# Patient Record
Sex: Male | Born: 1985 | Hispanic: No | Marital: Single
Health system: Midwestern US, Community
[De-identification: ages and names within clinical notes are randomized; demographics above are authoritative.]

## PROBLEM LIST (undated history)

## (undated) DIAGNOSIS — T7840XA Allergy, unspecified, initial encounter: Secondary | ICD-10-CM

## (undated) DIAGNOSIS — M1A00X0 Idiopathic chronic gout, unspecified site, without tophus (tophi): Secondary | ICD-10-CM

## (undated) HISTORY — DX: Allergy, unspecified, initial encounter: T78.40XA

---
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
## 2006-05-16 ENCOUNTER — Encounter: Admission: RE | Admit: 2006-05-16 | Discharge: 2006-05-16 | Payer: Self-pay | Admitting: Occupational Medicine

## 2006-06-04 ENCOUNTER — Inpatient Hospital Stay (HOSPITAL_COMMUNITY): Admission: EM | Admit: 2006-06-04 | Discharge: 2006-06-05 | Payer: Self-pay | Admitting: Emergency Medicine

## 2006-09-17 ENCOUNTER — Emergency Department (HOSPITAL_COMMUNITY): Admission: EM | Admit: 2006-09-17 | Discharge: 2006-09-17 | Payer: Self-pay | Admitting: Emergency Medicine

## 2010-12-31 ENCOUNTER — Emergency Department (HOSPITAL_COMMUNITY)
Admission: EM | Admit: 2010-12-31 | Discharge: 2011-01-01 | Disposition: A | Discharge status: Home / Self Care | Payer: BC Managed Care – PPO | Attending: Emergency Medicine | Admitting: Emergency Medicine

## 2010-12-31 DIAGNOSIS — Z0389 Encounter for observation for other suspected diseases and conditions ruled out: Secondary | ICD-10-CM | POA: Insufficient documentation

## 2011-01-01 ENCOUNTER — Emergency Department (HOSPITAL_COMMUNITY)
Admission: EM | Admit: 2011-01-01 | Discharge: 2011-01-01 | Disposition: A | Discharge status: Home / Self Care | Payer: BC Managed Care – PPO | Source: Home / Self Care | Attending: Emergency Medicine | Admitting: Emergency Medicine

## 2011-01-01 ENCOUNTER — Ambulatory Visit (INDEPENDENT_AMBULATORY_CARE_PROVIDER_SITE_OTHER): Payer: BC Managed Care – PPO

## 2011-01-01 ENCOUNTER — Inpatient Hospital Stay (INDEPENDENT_AMBULATORY_CARE_PROVIDER_SITE_OTHER)
Admission: RE | Admit: 2011-01-01 | Discharge: 2011-01-01 | Disposition: A | Discharge status: Home / Self Care | Payer: BC Managed Care – PPO | Source: Ambulatory Visit | Attending: Family Medicine | Admitting: Family Medicine

## 2011-01-01 DIAGNOSIS — F17200 Nicotine dependence, unspecified, uncomplicated: Secondary | ICD-10-CM | POA: Insufficient documentation

## 2011-01-01 DIAGNOSIS — D72819 Decreased white blood cell count, unspecified: Secondary | ICD-10-CM | POA: Insufficient documentation

## 2011-01-01 DIAGNOSIS — R509 Fever, unspecified: Secondary | ICD-10-CM

## 2011-01-01 DIAGNOSIS — T370X5A Adverse effect of sulfonamides, initial encounter: Secondary | ICD-10-CM | POA: Insufficient documentation

## 2011-01-01 DIAGNOSIS — D696 Thrombocytopenia, unspecified: Secondary | ICD-10-CM | POA: Insufficient documentation

## 2011-01-01 DIAGNOSIS — L02419 Cutaneous abscess of limb, unspecified: Secondary | ICD-10-CM | POA: Insufficient documentation

## 2011-01-01 LAB — CBC
HCT: 41.3 % (ref 39.0–52.0)
Hemoglobin: 15 g/dL (ref 13.0–17.0)
MCH: 30.5 pg (ref 26.0–34.0)
MCHC: 36.3 g/dL — ABNORMAL HIGH (ref 30.0–36.0)
MCV: 84.1 fL (ref 78.0–100.0)
Platelets: 82 10*3/uL — ABNORMAL LOW (ref 150–400)
RBC: 4.91 MIL/uL (ref 4.22–5.81)
RDW: 12.2 % (ref 11.5–15.5)
WBC: 2.3 10*3/uL — ABNORMAL LOW (ref 4.0–10.5)

## 2011-01-01 LAB — DIFFERENTIAL
Basophils Absolute: 0 10*3/uL (ref 0.0–0.1)
Basophils Relative: 1 % (ref 0–1)
Eosinophils Absolute: 0 10*3/uL (ref 0.0–0.7)
Eosinophils Relative: 0 % (ref 0–5)
Lymphocytes Relative: 21 % (ref 12–46)
Lymphs Abs: 0.5 10*3/uL — ABNORMAL LOW (ref 0.7–4.0)
Monocytes Absolute: 0.2 10*3/uL (ref 0.1–1.0)
Monocytes Relative: 10 % (ref 3–12)
Neutro Abs: 1.6 10*3/uL — ABNORMAL LOW (ref 1.7–7.7)
Neutrophils Relative %: 68 % (ref 43–77)

## 2011-01-01 LAB — COMPREHENSIVE METABOLIC PANEL
Albumin: 4.2 g/dL (ref 3.5–5.2)
BUN: 12 mg/dL (ref 6–23)
Calcium: 9 mg/dL (ref 8.4–10.5)
Creatinine, Ser: 1.03 mg/dL (ref 0.50–1.35)
Total Protein: 7 g/dL (ref 6.0–8.3)

## 2011-01-01 LAB — CK: Total CK: 128 U/L (ref 7–232)

## 2011-01-02 ENCOUNTER — Inpatient Hospital Stay (HOSPITAL_COMMUNITY)
Admission: EM | Admit: 2011-01-02 | Discharge: 2011-01-05 | DRG: 397 | Disposition: A | Discharge status: Home / Self Care | Payer: BC Managed Care – PPO | Attending: Internal Medicine | Admitting: Internal Medicine

## 2011-01-02 ENCOUNTER — Emergency Department (HOSPITAL_COMMUNITY): Payer: BC Managed Care – PPO

## 2011-01-02 DIAGNOSIS — D6959 Other secondary thrombocytopenia: Principal | ICD-10-CM | POA: Diagnosis present

## 2011-01-02 DIAGNOSIS — T370X5A Adverse effect of sulfonamides, initial encounter: Secondary | ICD-10-CM | POA: Diagnosis present

## 2011-01-02 DIAGNOSIS — R7402 Elevation of levels of lactic acid dehydrogenase (LDH): Secondary | ICD-10-CM | POA: Diagnosis present

## 2011-01-02 DIAGNOSIS — R5081 Fever presenting with conditions classified elsewhere: Secondary | ICD-10-CM | POA: Diagnosis present

## 2011-01-02 DIAGNOSIS — F17200 Nicotine dependence, unspecified, uncomplicated: Secondary | ICD-10-CM | POA: Diagnosis present

## 2011-01-02 DIAGNOSIS — E876 Hypokalemia: Secondary | ICD-10-CM | POA: Diagnosis present

## 2011-01-02 DIAGNOSIS — L02419 Cutaneous abscess of limb, unspecified: Secondary | ICD-10-CM | POA: Diagnosis present

## 2011-01-02 DIAGNOSIS — R7309 Other abnormal glucose: Secondary | ICD-10-CM | POA: Diagnosis present

## 2011-01-02 DIAGNOSIS — R109 Unspecified abdominal pain: Secondary | ICD-10-CM | POA: Diagnosis present

## 2011-01-02 DIAGNOSIS — D72819 Decreased white blood cell count, unspecified: Secondary | ICD-10-CM | POA: Diagnosis present

## 2011-01-02 DIAGNOSIS — R7401 Elevation of levels of liver transaminase levels: Secondary | ICD-10-CM | POA: Diagnosis present

## 2011-01-02 DIAGNOSIS — R82998 Other abnormal findings in urine: Secondary | ICD-10-CM | POA: Diagnosis not present

## 2011-01-02 LAB — COMPREHENSIVE METABOLIC PANEL
ALT: 75 U/L — ABNORMAL HIGH (ref 0–53)
AST: 80 U/L — ABNORMAL HIGH (ref 0–37)
Albumin: 3.4 g/dL — ABNORMAL LOW (ref 3.5–5.2)
Alkaline Phosphatase: 89 U/L (ref 39–117)
CO2: 23 mEq/L (ref 19–32)
Chloride: 100 mEq/L (ref 96–112)
Creatinine, Ser: 0.93 mg/dL (ref 0.50–1.35)
GFR calc non Af Amer: >60 mL/min (ref >60)
Potassium: 3.4 mEq/L — ABNORMAL LOW (ref 3.5–5.1)
Total Bilirubin: 1.5 mg/dL — ABNORMAL HIGH (ref 0.3–1.2)

## 2011-01-02 LAB — URINALYSIS, ROUTINE W REFLEX MICROSCOPIC
Glucose, UA: NEGATIVE mg/dL
Hgb urine dipstick: NEGATIVE
Specific Gravity, Urine: 1.028 (ref 1.005–1.030)
pH: 5.5 (ref 5.0–8.0)

## 2011-01-02 LAB — DIFFERENTIAL
Basophils Relative: 3 % — ABNORMAL HIGH (ref 0–1)
Eosinophils Absolute: 0 10*3/uL (ref 0.0–0.7)
Eosinophils Relative: 0 % (ref 0–5)
Lymphocytes Relative: 24 % (ref 12–46)
Neutrophils Relative %: 59 % (ref 43–77)

## 2011-01-02 LAB — CBC
HCT: 38.7 % — ABNORMAL LOW (ref 39.0–52.0)
MCV: 83.9 fL (ref 78.0–100.0)
Platelets: 41 10*3/uL — ABNORMAL LOW (ref 150–400)
RBC: 4.61 MIL/uL (ref 4.22–5.81)
WBC: 1.6 10*3/uL — ABNORMAL LOW (ref 4.0–10.5)

## 2011-01-02 LAB — LACTIC ACID, PLASMA: Lactic Acid, Venous: 0.7 mmol/L (ref 0.5–2.2)

## 2011-01-02 LAB — PROCALCITONIN: Procalcitonin: 0.43 ng/mL

## 2011-01-02 LAB — URINE MICROSCOPIC-ADD ON

## 2011-01-02 LAB — PROTIME-INR: INR: 1.21 (ref 0.00–1.49)

## 2011-01-03 DIAGNOSIS — D709 Neutropenia, unspecified: Secondary | ICD-10-CM

## 2011-01-03 LAB — BASIC METABOLIC PANEL WITH GFR
BUN: 11 mg/dL (ref 6–23)
CO2: 25 meq/L (ref 19–32)
Calcium: 8.3 mg/dL — ABNORMAL LOW (ref 8.4–10.5)
Chloride: 104 meq/L (ref 96–112)
Creatinine, Ser: 0.75 mg/dL (ref 0.50–1.35)
GFR calc Af Amer: >60 mL/min
GFR calc non Af Amer: >60 mL/min
Glucose, Bld: 104 mg/dL — ABNORMAL HIGH (ref 70–99)
Potassium: 3.3 meq/L — ABNORMAL LOW (ref 3.5–5.1)
Sodium: 135 meq/L (ref 135–145)

## 2011-01-03 LAB — CBC
HCT: 36.1 % — ABNORMAL LOW (ref 39.0–52.0)
Hemoglobin: 12.6 g/dL — ABNORMAL LOW (ref 13.0–17.0)
MCH: 29.6 pg (ref 26.0–34.0)
MCHC: 34.9 g/dL (ref 30.0–36.0)
RDW: 12.4 % (ref 11.5–15.5)

## 2011-01-03 LAB — TSH: TSH: 3.069 u[IU]/mL (ref 0.350–4.500)

## 2011-01-03 LAB — DIFFERENTIAL
Band Neutrophils: 6 % (ref 0–10)
Basophils Absolute: 0 K/uL (ref 0.0–0.1)
Basophils Relative: 0 % (ref 0–1)
Blasts: 0 %
Eosinophils Absolute: 0 K/uL (ref 0.0–0.7)
Eosinophils Relative: 0 % (ref 0–5)
Lymphocytes Relative: 26 % (ref 12–46)
Lymphs Abs: 0.4 K/uL — ABNORMAL LOW (ref 0.7–4.0)
Metamyelocytes Relative: 0 %
Monocytes Absolute: 0.1 K/uL (ref 0.1–1.0)
Monocytes Relative: 5 % (ref 3–12)
Myelocytes: 0 %
Neutro Abs: 1 K/uL — ABNORMAL LOW (ref 1.7–7.7)
Neutrophils Relative %: 63 % (ref 43–77)
Promyelocytes Absolute: 0 %
nRBC: 0 /100{WBCs}

## 2011-01-03 LAB — BASIC METABOLIC PANEL
BUN: 12 mg/dL (ref 6–23)
Calcium: 8.3 mg/dL — ABNORMAL LOW (ref 8.4–10.5)
GFR calc Af Amer: >60 mL/min (ref >60)
GFR calc non Af Amer: >60 mL/min (ref >60)
Glucose, Bld: 109 mg/dL — ABNORMAL HIGH (ref 70–99)
Potassium: 3.5 mEq/L (ref 3.5–5.1)
Sodium: 134 mEq/L — ABNORMAL LOW (ref 135–145)

## 2011-01-03 LAB — HEPATITIS PANEL, ACUTE
HCV Ab: NEGATIVE
Hep A IgM: NEGATIVE
Hep B C IgM: NEGATIVE
Hepatitis B Surface Ag: NEGATIVE

## 2011-01-03 LAB — HEMOGLOBIN A1C
Hgb A1c MFr Bld: 6.1 % — ABNORMAL HIGH
Mean Plasma Glucose: 128 mg/dL — ABNORMAL HIGH

## 2011-01-03 LAB — HIV ANTIBODY (ROUTINE TESTING W REFLEX): HIV: NONREACTIVE

## 2011-01-04 ENCOUNTER — Inpatient Hospital Stay (HOSPITAL_COMMUNITY): Payer: BC Managed Care – PPO

## 2011-01-04 DIAGNOSIS — D61818 Other pancytopenia: Secondary | ICD-10-CM

## 2011-01-04 LAB — CBC
HCT: 37 % — ABNORMAL LOW (ref 39.0–52.0)
Hemoglobin: 13.3 g/dL (ref 13.0–17.0)
MCH: 30.3 pg (ref 26.0–34.0)
MCHC: 35.9 g/dL (ref 30.0–36.0)
MCV: 84.3 fL (ref 78.0–100.0)
Platelets: 35 10*3/uL — ABNORMAL LOW (ref 150–400)
RBC: 4.39 MIL/uL (ref 4.22–5.81)
RDW: 12.8 % (ref 11.5–15.5)
WBC: 6 10*3/uL (ref 4.0–10.5)

## 2011-01-04 LAB — DIFFERENTIAL
Basophils Relative: 3 % — ABNORMAL HIGH (ref 0–1)
Eosinophils Absolute: 0.1 10*3/uL (ref 0.0–0.7)
Eosinophils Relative: 1 % (ref 0–5)
Lymphs Abs: 1.2 10*3/uL (ref 0.7–4.0)
Monocytes Absolute: 0.6 10*3/uL (ref 0.1–1.0)
Neutro Abs: 3.9 10*3/uL (ref 1.7–7.7)

## 2011-01-04 LAB — URINE CULTURE
Colony Count: NO GROWTH
Culture  Setup Time: 201207220221

## 2011-01-04 LAB — BASIC METABOLIC PANEL
CO2: 27 mEq/L (ref 19–32)
Calcium: 8.5 mg/dL (ref 8.4–10.5)
Chloride: 105 mEq/L (ref 96–112)
Sodium: 138 mEq/L (ref 135–145)

## 2011-01-04 LAB — PATHOLOGIST SMEAR REVIEW

## 2011-01-04 LAB — ROCKY MTN SPOTTED FVR AB, IGG-BLOOD: RMSF IgG: 0.08 IV

## 2011-01-04 NOTE — H&P (Signed)
[name], [name] NO.:  [id number]  MEDICAL RECORD NO.:  [id number]  LOCATION:  WLED                         FACILITY:  [facility]  PHYSICIAN:  Della Goo, M.D. DATE OF BIRTH:  August 12, 1985  DATE OF ADMISSION:  01/02/2011 DATE OF DISCHARGE:                             HISTORY & PHYSICAL   PRIMARY CARE PHYSICIAN:  Unassigned.  CHIEF COMPLAINT:  Fevers.  HISTORY OF PRESENT ILLNESS:  This is a 25 year old male, who presents to the [facility] Emergency Department secondary to complaints of worsening fevers and chills along with headaches and myalgias for the past 5 days.  The patient had been seen 1 day ago in the emergency department and in the [facility] as well secondary to similar complaints.  Blood cultures were done x2 and at 24 hours, there is no growth.  The patient had been found to have thrombocytopenia at that time with a platelet level of 82.  He returned and has been seen in the emergency department today.  His laboratory studies have worsened.  His white blood cell count is 1.6.  His platelets have dropped to 41.  His hemoglobin, however, is 13.6.  More history in regard to the patient's illness, the patient was diagnosed with an abscess, possible community MRSA on the right thigh and underwent an incision and drainage at the [facility] on July 9th.  He was started on Bactrim therapy at that time.  The patient continued on the Bactrim therapy until 5 days ago when he began to have fevers and chills and feeling worse.  He says he stopped the Bactrim therapy at that time.  The patient states at home, he had been having temperatures to 103 and 104,  however the abscess of the right thigh did get better.  In the emergency department today, the emergency department physician, Dr. Rosalia Hammers, evaluated this patient and had concerns that [facility] Spotted Fever was in the differential.  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  He has no known  history of seeing a tick or having a tick bite.  [place] Spotted Fever titers were performed along with blood cultures, chest x- ray, and urinalysis and the patient was placed on Rocephin therapy along with IV doxycycline.  He was referred for medical admission, but also IV fluids have been given for fluid resuscitation and antipyretic therapy has also been given.  Of note, the patient had been taking aspirin daily and this will be discontinued secondary to his low platelets.  PAST MEDICAL HISTORY:  Significant for, 1. Peritonsillar abscess incision and drainage in the past. 2. Also, the patient has had a history of a cholesteatoma of the left     ear, status post surgical resection.  MEDICATIONS:  Bactrim therapy double-strength 1 tablet p.o. b.i.d., which he took 8 days of therapy.  Also, the patient had been taking aspirin daily.  ALLERGIES:  PENICILLIN.  SOCIAL HISTORY:  The patient is married.  He works as a Games developer. He is a smoker, smokes a pack of cigarettes daily.  He reports not  smoking at all since he has been ill.  He denies any alcohol usage, denies any illicit drug usage.  FAMILY HISTORY:  Noncontributory.  REVIEW OF SYSTEMS:  Pertinents mentioned above.  Also, the patient has had nausea, but no vomiting and he has had lower abdominal pain.  He denies having any dysuria or diarrhea.  He also denies having any syncope or seizures.  PHYSICAL EXAMINATION FINDINGS:  GENERAL:  The 25 year old thin, well- nourished, well-developed appearing Caucasian male, who is in no acute distress currently. VITAL SIGNS:  Temperature 102.9; blood pressure initially 129/100, now 100/41; heart rate 92; respirations 20; O2 sats 94% to 96%. HEENT:  Normocephalic, atraumatic.  Pupils equally round, reactive to light.  Extraocular movements are intact.  Funduscopic benign.  There is no scleral  icterus.  Nares are patent bilaterally.  Oropharynx is clear. NECK:  Supple, full range of motion.  No thyromegaly, adenopathy, or jugular venous distention. CARDIOVASCULAR:  Regular rate and rhythm.  No murmurs, gallops, or rubs appreciated.  Normal S1, S2. RESPIRATORY:  Chest wall normal excursion and breathing is unlabored. Chest wall excursion is symmetric.  Lungs are clear to auscultation bilaterally.  No rales, rhonchi or wheezes. ABDOMEN:  Positive bowel sounds, soft, nontender, nondistended.  There is no hepatosplenomegaly.  There is no rebound, no guarding, and deep palpation is able to be performed of the abdomen.  There is no suprapubic tenderness. BACK:  No spinous process tenderness.  No costovertebral angle tenderness.  No palpable spasms. EXTREMITIES:  Without cyanosis, clubbing, or edema. SKIN:  The patient has numerous tattoos, but there is no rash that is seen.  No rash or exanthem. NEUROLOGIC:  The patient is alert and oriented x3.  Cranial nerves are intact.  He is able to move all 4 of his extremities.  There are no motor or sensory deficits.  LABORATORY STUDIES:  White blood cell count 1.6, hemoglobin 13.6, hematocrit 38.7, platelets 41.  Sodium 132, potassium 3.4, chloride 100, CO2 23, BUN 12, creatinine 0.93, and glucose 118.  Prothrombin time 15.6, INR 1.21, PTT 43.  Chest x-ray is negative revealing no acute cardiopulmonary disease findings.  Urinalysis reveals small leukocyte esterase, moderate bilirubin, and protein of 30.  Albumin 3.4, total protein 6.3, AST 80, ALT 75, alkaline phosphatase 89, total bilirubin 1.5.  ASSESSMENT:  This is a 25 year old male being admitted with, 1. Febrile illness, sepsis versus sepsis syndrome. 2. Neutropenia. 3. Thrombocytopenia. 4. Elevated transaminases. 5. Lower abdominal pain. 6. Nausea.  PLAN:  The patient will be admitted, blood cultures x2 have been ordered.  The [facility] spotted fever titers have also  been sent. A lactic acid level and procalcitonin level will also be ordered and the patient has been placed on empiric antibiotic therapy at this time of Rocephin and doxycycline.  A hepatitis panel will also be started along with an HIV test will also be sent and the aspirin therapy has been discontinued as far as the low platelets go.  The white blood cell count and platelets will be monitored and a hematology consultation has been requested.  Dr. Myna Hidalgo has been contacted who is on-call and he will see this patient in the a.m.  The etiology of the patient's neutropenia and thrombocytopenia may be secondary to sepsis, may also be a consequence of the Bactrim therapy.  However, other infectious etiologies will be evaluated such as hepatitis and possible HIV.  Other considerations, which were less likely, are possible parvovirus, however, the patient's red blood cell  lines are within normal indices at this time and the patient also does not have a viral erythematous exanthem.  IV fluids have been ordered too for fluid resuscitation  nd SCDs have been ordered for DVT prophylaxis.  Antiemetic therapy has also been ordered as well and the patient will be further monitored for further clinical changes and his antibiotic therapy will be further adjusted pending results of his studies.     Della Goo, M.D.     HJ/MEDQ  D:  01/02/2011  T:  01/02/2011  Job:  [id number]  Electronically Signed by Della Goo M.D. on 01/04/2011 12:59:07 PM

## 2011-01-05 LAB — DIFFERENTIAL
Eosinophils Relative: 2 % (ref 0–5)
Lymphocytes Relative: 37 % (ref 12–46)
Lymphs Abs: 2.6 10*3/uL (ref 0.7–4.0)
Monocytes Relative: 15 % — ABNORMAL HIGH (ref 3–12)
Neutrophils Relative %: 46 % (ref 43–77)

## 2011-01-05 LAB — LEAD, BLOOD: Lead-Whole Blood: 0.1 ug/dL (ref <10.0)

## 2011-01-05 LAB — BASIC METABOLIC PANEL
CO2: 25 mEq/L (ref 19–32)
Chloride: 105 mEq/L (ref 96–112)
Glucose, Bld: 93 mg/dL (ref 70–99)
Potassium: 3.3 mEq/L — ABNORMAL LOW (ref 3.5–5.1)
Sodium: 136 mEq/L (ref 135–145)

## 2011-01-05 LAB — PATHOLOGIST SMEAR REVIEW

## 2011-01-05 LAB — CBC
Hemoglobin: 11.9 g/dL — ABNORMAL LOW (ref 13.0–17.0)
RBC: 4.05 MIL/uL — ABNORMAL LOW (ref 4.22–5.81)
WBC: 7.1 10*3/uL (ref 4.0–10.5)

## 2011-01-07 LAB — CULTURE, BLOOD (ROUTINE X 2)
Culture  Setup Time: 201207202140
Culture: NO GROWTH

## 2011-01-09 LAB — CULTURE, BLOOD (ROUTINE X 2): Culture: NO GROWTH

## 2011-01-29 NOTE — Discharge Summary (Signed)
[name], [name] NO.:  [id number]  MEDICAL RECORD NO.:  [id number]  LOCATION:  1313                         FACILITY:  [facility]  PHYSICIAN:  Rosanna Randy, MDDATE OF BIRTH:  1986-05-11  DATE OF ADMISSION:  01/02/2011 DATE OF DISCHARGE:  01/05/2011                              DISCHARGE SUMMARY   Dr. Windle Guard who is going to become the patient primary care physician and will follow with him in the next 7-10 days.  DISCHARGE DIAGNOSES: 1. Thrombocytopenia/leukopenia secondary to the use of Bactrim. 2. Transaminitis, most likely secondary to Bactrim as well drug-     related fever. 3. Hyperglycemia. 4. Hypokalemia. 5. Abdominal pain with associated nausea and vomiting, most likely     secondary to viral gastroenteritis. 6. Pyuria with a negative urine culture. 7. 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  DISCHARGE MEDICATIONS: 1. Tylenol 650 mg every 6 hours as needed for fever and pain. 2. Multivitamins 1 tablet by mouth daily.  DISPOSITION AND FOLLOWUP:  The patient had been discharged in stable and improved condition.  Currently, not complaining of any abdominal pain, nausea, vomiting, shortness of breath, or any other acute discomfort. The patient is going to arrange hospital followup appointment with Dr. Windle Guard over the next 7-10 days in order to establish relationship as a primary care physician and also at that moment to follow on his white blood cells and platelets level.  It will be also important to repeat a fasting blood sugar over the next 3 months and a hemoglobin A1c since the patient had elevated blood sugar found during this admission with a hemoglobin A1c of 6.1.  In case that the patient's platelets or white blood cells continued to be low, he is going to require and arrange followup with Dr. Arlan Organ  who is a hematology oncologist following the case along.  The patient was  instructed to follow a low- carbohydrate diet and more than anything to stop the over use of power energy drinks he was initially doing prior to admission.  PROCEDURES PERFORMED:  During this hospitalization, the patient had a chest x-ray 2 views on July 20th at 11:28 that demonstrated normal chest radiograph without acute pulmonary infiltrates.  He had a repeat 2 views x-ray on July 21st that demonstrated no active cardiopulmonary disease and also an ultrasound of his abdomen that demonstrated mild gallbladder thickening without gallstones and otherwise completely normal.  No other procedures were performed during this hospitalization.  Hematology/Oncology was consulted due to the patient's leukopenia and thrombocytopenia, specifically Dr. Arlan Organ.  HISTORY OF PRESENT ILLNESS:  For full details, please refer to dictation done on January 02, 2011, by Dr. Lovell Sheehan, but briefly this is a 25 year old male who presented to the [facility] Emergency Department secondary to complaints of worsening fever and chills along with headaches and myalgia for the past 5 days prior to admission.  In the ED, he was found to have platelets of 41 with white blood cells count of 1.6.  At that moment, [facility] hospitalist was called to admit the patient.  He also had a  temperature of 103.  PERTINENT LABORATORY DATA:  Throughout this admission includes a comprehensive metabolic panel that demonstrated a sodium of 132, potassium 3.4, chloride 100, bicarbonate 23, glucose 118, BUN 12, creatinine 0.93.  Alkaline phosphatase 89, AST 80, ALT 75, albumin 3.4, calcium 8.6. Blood culture negative x2 until the moment of discharge without any growth.  PT 15.6 with an INR of 1.21, PTT 43.  A CBC with white blood cells of 1.6, hemoglobin 13.6, platelets 41, monocytes count was 14, basophils 3.  Urinalysis demonstrated small amount of leukocytes, but negative nitrite, negative blood.  Microscopy 0-2 white blood  cells. Urine culture, no growth.  Procalcitonin level was 0.43, lactic acid 0.7.  HIV was nonreactive.  Hepatitis acute panel was negative.  TSH 3.069.  A subsequent BMET demonstrated a sodium of 134, potassium 3.5, chloride 103, bicarbonate 24, glucose of 110, BUN 12, creatinine 0.88, calcium 8.3, hemoglobin A1c was 6.1 with mean plasma glucose of 128. The patient had [facility] spotted fever, IgM and IgG within normal range 0.49 and 0.10 respectively.  At discharge, CBC demonstrated white blood cells of 7.1, hemoglobin 11.9, platelets 61, the patient's platelets dropped down up to 35 at one point throughout this hospitalization, late blood level was 0.1.  HOSPITAL COURSE BY PROBLEM: 1. Thrombocytopenia and leukopenia, most likely secondary to Bactrim     exposure.  The patient had about 10 days course of Bactrim prior to     coming in to the hospital with his new symptoms and findings.  As     per consultation with Hem/Onc and his recommendation, most likely     appears to be a myelosuppression process caused by the Bactrim.  He     received 1 dose of Neupogen that stimulate protrusion of his white     blood cells.  The patient remained afebrile over 36 hours and his     numbers continued to improve.  At that time, the patient had been     discharged with followup as an outpatient with his new to be     primary care physician, Dr. Windle Guard, in order to have his     white blood cells and platelets level checked again and if they are     still low, arrange followup with Dr. Myna Hidalgo as an outpatient. 2. Transaminitis that might be also secondary to Bactrim with a     negative workup as was done during this hospitalization.  This is     something that will be also followed as an outpatient.  The patient     had been instructed to avoid alcohol or any other substance that     may increase the risk of developing worsening liver function. 3. Drug-related fever.  Once the antibiotics  were completely taken     off, the patient has not spiked any further fever, there had not     been any source of infection.  At this point, the patient is going     to be discharged home with outpatient followup and he had agreed     that he spike any fever or feels bad in any way, he will return to     the hospital right away.  He had received instructions to use     Tylenol every 6 hours in order to help with these symptoms if     needed. 4. Hyperglycemia.  Not otherwise specified without history of     diabetes.  His hemoglobin  A1c is 6.1.  He had a blood sugar of 110     with a mean plasma by his A1c of 128.  At this moment, he qualifies     as a prediabetic stage.  Lifestyle modifications have been     discussed with him including a low-carbohydrate diet and     specifically to avoid power energy drinks which is something that     he used all the time.  He is going to follow with primary care     physician as an outpatient for further followup and treatment if     needed. 5. Hypokalemia which had been repleted throughout this     hospitalization.  At discharge, potassium was in the normal range. 6. Abdominal pain with associated nausea and vomiting, could be     associated with Bactrim versus just a viral gastroenteritis that     improved after fluid resuscitation and supportive care.  At     discharge, he is not having any nausea, any vomiting, and no     abdominal pain. 7. Pyuria.  On admission, urinalysis with a negative urine culture.     Initially, the patient received coverage with Rocephin for 2 days,     but after having negative culture, antibiotics have been completely     discontinued.  The patient remains to be afebrile without any     dysuria. 8. Recurrent boils skin infection, most likely due to his prediabetic     state.  He is going to follow with primary care physician and he     had been advised not to burst any boils to prevent further      infection.  PHYSICAL EXAMINATION:  VITAL SIGNS:  At discharge, temperature 97.7, heart rate 58, respiratory rate 16, blood pressure 124/58, oxygen saturation 98% on room air. GENERAL:  No acute distress. RESPIRATORY SYSTEM:  Clear to auscultation bilaterally. HEART:  Regular rate and rhythm.  No murmurs, gallops, or rubs. ABDOMEN:  Soft, nontender, nondistended with positive bowel sounds. EXTREMITIES:  Without edema, cyanosis, or clubbing. NEUROLOGIC:  Nonfocal.     Rosanna Randy, MD     CEM/MEDQ  D:  01/05/2011  T:  01/05/2011  Job:  [id number]  cc:   Windle Guard, M.D. Fax: [phone]  Electronically Signed by Vassie Loll MD on 01/29/2011 05:45:30 PM

## 2011-02-01 NOTE — Consult Note (Signed)
[name], BOLLS NO.:  [id number]  MEDICAL RECORD NO.:  [id number]  LOCATION:  1313                         FACILITY:  [facility]  PHYSICIAN:  Josph Clayton, M.D.  DATE OF BIRTH:  12-18-1985  DATE OF CONSULTATION:  01/03/2011 DATE OF DISCHARGE:                                CONSULTATION   REFERRING PHYSICIAN:  Della Goo, M.D.  REASON FOR CONSULTATION: 1. Leukopenia/thrombocytopenia. 2. Possible reaction to Bactrim. 3. History of Staph abscess on right thigh.  HISTORY OF PRESENT ILLNESS:  [name] is a 25 year old white gentleman. There is no past medical history.  He works as a Games developer.  He developed a Staph wound infection on his right thigh back a couple of weeks ago.  He was started on Bactrim for this.  He took Bactrim for about a week.  He then began to get some spiking temperatures.  He had little nausea, but no vomiting.  He had some chills, but no rigors.  He has a blood culture taken down 2 days ago.  These were negative.  On the January 01, 2011, his white cell count was 2.3, hemoglobin 15 and platelet count 82,000.  He subsequently was admitted on the January 02, 2011 with a high temperature of 102.9.  He did have a chest x-ray done on the January 02, 2011.  This was negative.  He does feel fatigued.  When he was admitted on the January 02, 2011, his white cell count was down to 1.6, hemoglobin 13.6, and hematocrit 38.7, platelet count was down to 41,000.  He had a lactic acid that was 0.7.  Procalcitonin was 0.43.  His HIV was nonreactive.  He had hepatitis panel secondary to some mildly elevated LFTs and these were all negative.  I was asked by Dr. Lovell Sheehan to see the patient.  PAST MEDICAL HISTORY:  His past medical history is relatively unremarkable.  ALLERGIES: 1. PENICILLIN. 2. I would also consider BACTRIM as a allergy.  MEDICATIONS:  His admission medications were none.  CURRENT MEDICATIONS:  He currently is  on: 1. Rocephin 1 gram IV daily. 2. Doxycycline 100 mg IV q.12h.  SOCIAL HISTORY:  Remarkable for some tobacco use.  There is some alcohol use.  There is no recreational drug use.  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  FAMILY HISTORY:  Noncontributory.  PHYSICAL EXAMINATION:  GENERAL:  This is a well-developed, well- nourished white gentleman, in no obvious distress. VITAL SIGNS:  Show a temperature of 100.6, pulse 77, respiratory rate 18, blood pressure 110/55. HEAD AND NECK EXAMINATION:  Shows normocephalic, atraumatic skull.  He is somewhat flushed in face.  There are no ocular or oral lesions. There is no scleral icterus.  There is no adenopathy in the neck. LUNGS:  Clear bilaterally.  There is no rales, wheezes or rhonchi. CARDIAC EXAMINATION:  Regular rate and rhythm with normal S1 and S2. There are no murmurs, rubs or bruits. ABDOMINAL EXAMINATION:  Soft.  Good bowel sounds.  There is no palpable abdominal mass.  There is no fluid wave.  There is no palpable hepatosplenomegaly. BACK EXAMINATION:  No tenderness of the spine, ribs  or hips. EXTREMITIES:  Shows the healing wound on the right lateral thigh.  There is no associated erythema with this wound.  He has good range of motion of his joints.  He has good pulses in his distal extremities. SKIN EXAMINATION:  Shows no unusual rashes, ecchymosis or petechiae. NEUROLOGICAL EXAMINATION:  Shows no focal neurological deficits.  LABORATORY DATA:  His laboratory studies show white cell count of 1.5, hemoglobin 12.6, hematocrit 36.1, platelet count 32,000.  MCV is 85. Sodium 134, potassium 3.5, BUN 12, creatinine 0.88.  Peripheral smear shows a normochromic normocytic palpation of red blood cells.  There is no nucleated red blood cells.  I see no schistocytes.  There is no polychromasia.  There is no rouleaux formation.  White cells appear decreased in number.  I do not see any immature myeloid cells.  He has no  hypersegmented polys.  No atypical lymphocytes.  He has no immature myeloid cells.  Platelets are decreased in number.  Most of the platelets are quite large and well granulated.  IMPRESSION:  [name] is a 25 year old gentleman with progressive leukopenia and thrombocytopenia.  This in the context of recently being on Bactrim.  I had to believe that this is a Bactrim reaction.  Everything certainly seems to fit with Bactrim being the culprit.  His blood smear looks unremarkable for any underlying hematologic malignancy.  Given the platelets are large, one has to wonder about an immune-mediated phenomenon, which Bactrim certainly could initiate.  It is also possible that this might be some lingering infection.  As such, I agree with the antibiotics that he is taking.  He does look somewhat sick.  As such, I think we will give him a dose of Neupogen.  I know this is somewhat controversial, but I do not see a real down sign to give no doses of Neupogen to try to get his white cell count back up a little bit.  It is possible that this might take several days to resolve.  If his platelet count continues to decline, one might consider intravenous immunoglobulin or some steroids to try to pick the platelet count back up.  [name] is although nice.  He is with his wife.  I talked to him at length.  He has good understanding of the situation.  We will follow along with the hospitalist team and help out as we can.     Josph Clayton, M.D.     PRE/MEDQ  D:  01/03/2011  T:  01/03/2011  Job:  [id number]  cc:   Della Goo, M.D. Fax: [phone]  Electronically Signed by Arlan Organ  on 02/01/2011 07:19:46 AM

## 2011-08-19 IMAGING — US US ABDOMEN COMPLETE
1 series · 14 of 25 positions shown · non-contrast
Comparison: None.

CLINICAL DATA: Abdominal pain with nausea and vomiting

COMPLETE ABDOMINAL ULTRASOUND

[Series 1: us abdomen complete · 0.32mm/px · 14 of 44 slices shown]
[im 1/44]
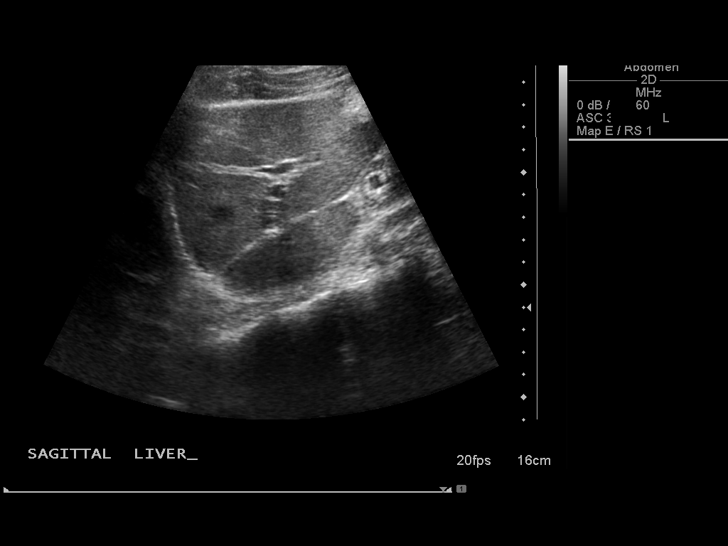
[im 4/44]
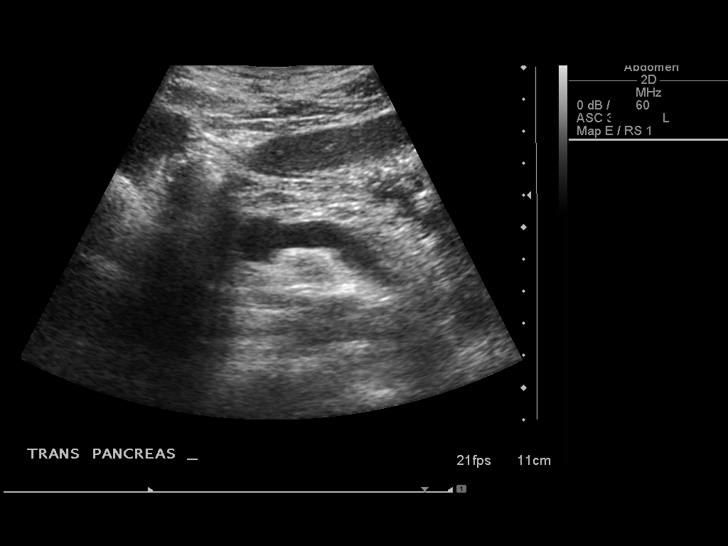
[im 8/44]
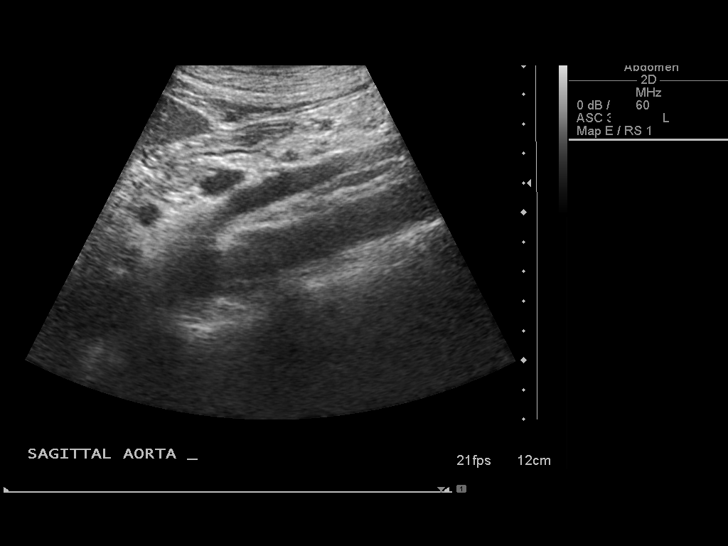
[im 11/44]
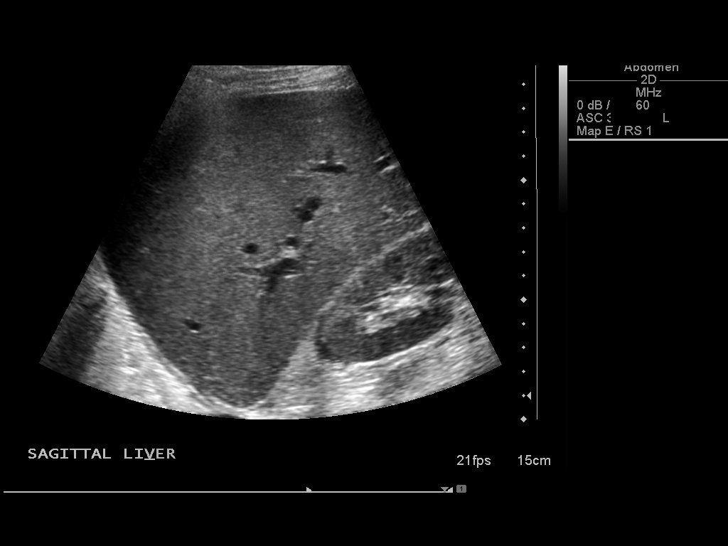
[im 15/44]
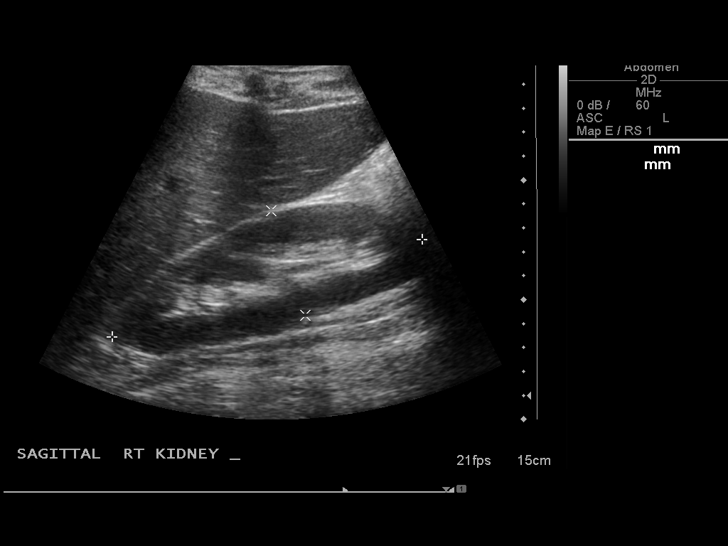
[im 17/44]
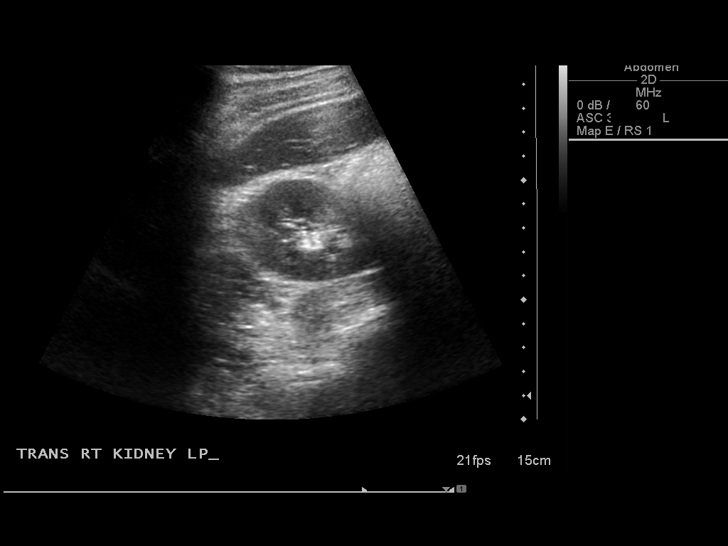
[im 20/44]
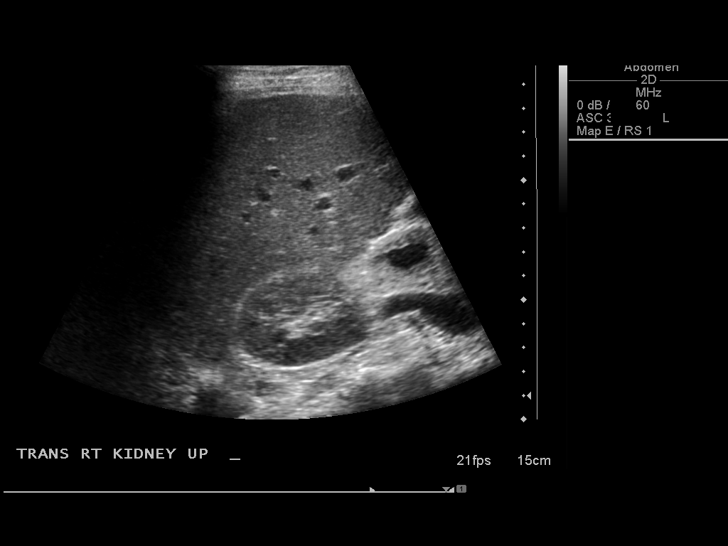
[im 24/44]
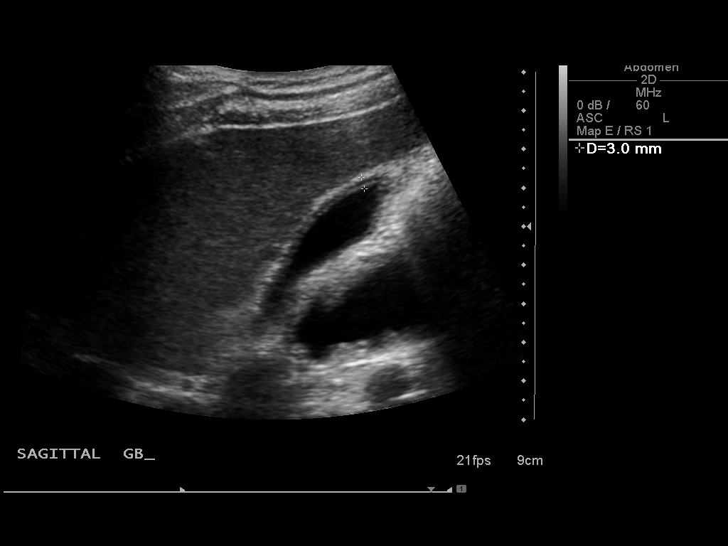
[im 27/44]
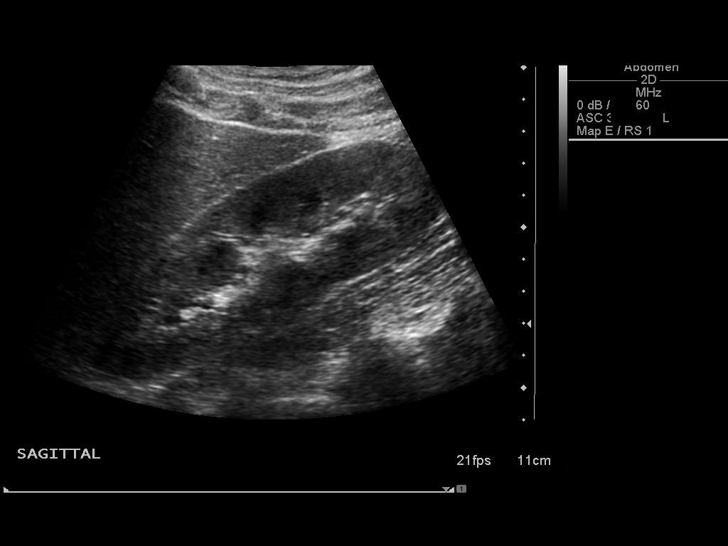
[im 29/44]
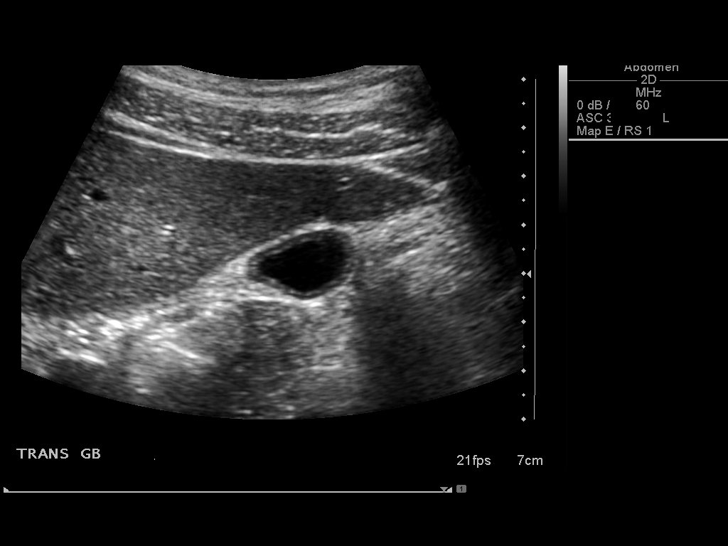
[im 33/44]
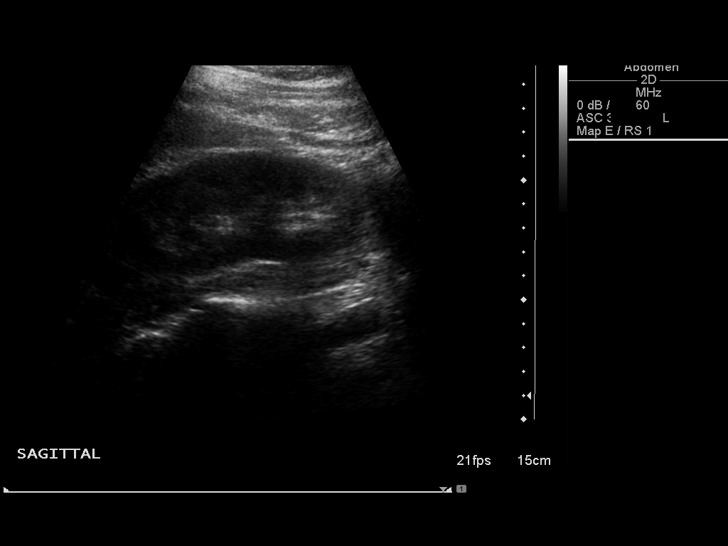
[im 36/44]
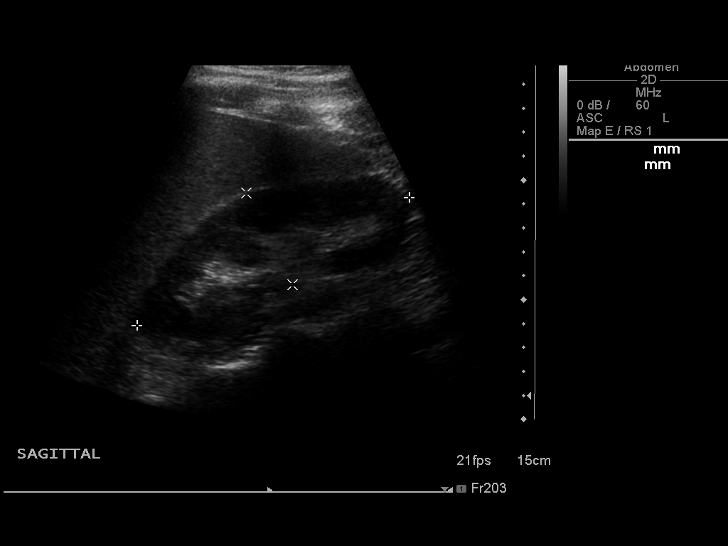
[im 40/44]
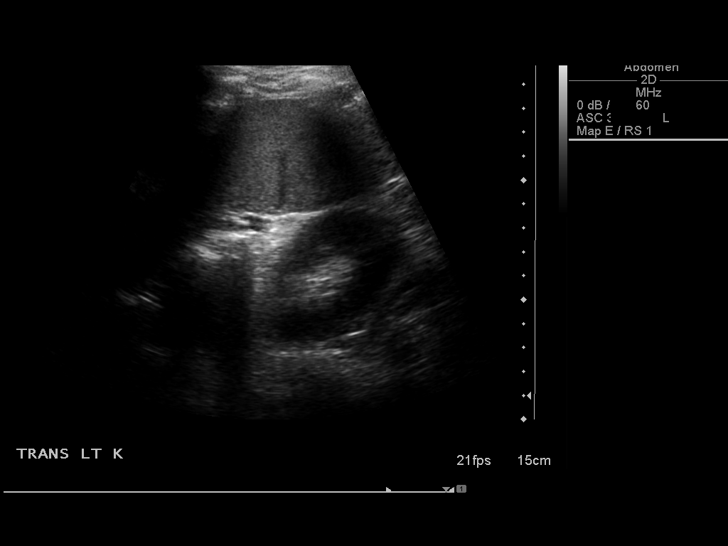
[im 44/44]
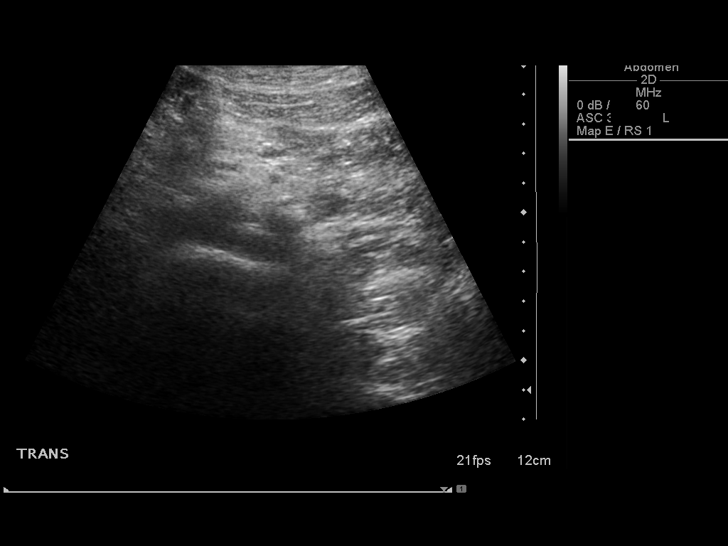

[14 of 25 positions shown; findings below may reference images not displayed]

FINDINGS: Gallbladder:  Negative for gallstones.  Gallbladder wall is mildly
thickened measuring 3.0 mm.  No pain over the gallbladder during
scanning.

Common bile duct:  3.4 mm

Liver:  No focal liver lesions are identified.

IVC:  Appears normal.

Pancreas:  No focal abnormality seen.

Spleen:  Spleen is mildly enlarged measuring 13.7 cm in length.
Splenic volume is 200 ml

Right Kidney:  13.5 cm in length without obstruction or mass.

Left Kidney:  12.5 cm in length without obstruction or mass.

Abdominal aorta:  No aneurysm identified.
IMPRESSION: Mild gallbladder thickening without gallstones.  This could be due
to acute or chronic cholecystitis.  However the patient is not
tender at this time.

## 2014-02-10 ENCOUNTER — Ambulatory Visit (INDEPENDENT_AMBULATORY_CARE_PROVIDER_SITE_OTHER): Payer: BC Managed Care – PPO | Admitting: Internal Medicine

## 2014-02-10 VITALS — BP 126/74 | HR 98 | Temp 98.5°F | Resp 18 | Ht 74.5 in | Wt 174.8 lb

## 2014-02-10 DIAGNOSIS — R509 Fever, unspecified: Secondary | ICD-10-CM

## 2014-02-10 DIAGNOSIS — M2550 Pain in unspecified joint: Secondary | ICD-10-CM

## 2014-02-10 DIAGNOSIS — L500 Allergic urticaria: Secondary | ICD-10-CM

## 2014-02-10 LAB — POCT URINALYSIS DIPSTICK
GLUCOSE UA: NEGATIVE
LEUKOCYTES UA: NEGATIVE
NITRITE UA: NEGATIVE
PH UA: 5.5
Protein, UA: 30
RBC UA: NEGATIVE
Spec Grav, UA: 1.02
UROBILINOGEN UA: 4

## 2014-02-10 LAB — POCT CBC
GRANULOCYTE PERCENT: 76.4 % (ref 37–80)
HCT, POC: 48.8 % (ref 43.5–53.7)
Hemoglobin: 16 g/dL (ref 14.1–18.1)
Lymph, poc: 2 (ref 0.6–3.4)
MCH: 29.4 pg (ref 27–31.2)
MCHC: 32.7 g/dL (ref 31.8–35.4)
MCV: 89.9 fL (ref 80–97)
MID (CBC): 0.3 (ref 0–0.9)
MPV: 6.7 fL (ref 0–99.8)
PLATELET COUNT, POC: 260 10*3/uL (ref 142–424)
POC GRANULOCYTE: 7.6 — AB (ref 2–6.9)
POC LYMPH PERCENT: 20.6 %L (ref 10–50)
POC MID %: 3 % (ref 0–12)
RBC: 5.43 M/uL (ref 4.69–6.13)
RDW, POC: 12.5 %
WBC: 9.9 10*3/uL (ref 4.6–10.2)

## 2014-02-10 LAB — COMPREHENSIVE METABOLIC PANEL
ALBUMIN: 4.3 g/dL (ref 3.5–5.2)
ALK PHOS: 93 U/L (ref 39–117)
ALT: 15 U/L (ref 0–53)
AST: 20 U/L (ref 0–37)
BUN: 15 mg/dL (ref 6–23)
CO2: 28 mEq/L (ref 19–32)
Calcium: 9.4 mg/dL (ref 8.4–10.5)
Chloride: 100 mEq/L (ref 96–112)
Creat: 1 mg/dL (ref 0.50–1.35)
Glucose, Bld: 85 mg/dL (ref 70–99)
POTASSIUM: 4 meq/L (ref 3.5–5.3)
SODIUM: 138 meq/L (ref 135–145)
TOTAL PROTEIN: 7.2 g/dL (ref 6.0–8.3)
Total Bilirubin: 1.2 mg/dL (ref 0.2–1.2)

## 2014-02-10 LAB — POCT UA - MICROSCOPIC ONLY
Bacteria, U Microscopic: NEGATIVE
CASTS, UR, LPF, POC: NEGATIVE
CRYSTALS, UR, HPF, POC: NEGATIVE
Epithelial cells, urine per micros: NEGATIVE
RBC, URINE, MICROSCOPIC: NEGATIVE
WBC, Ur, HPF, POC: NEGATIVE
Yeast, UA: NEGATIVE

## 2014-02-10 LAB — POCT SEDIMENTATION RATE: POCT SED RATE: 22 mm/hr (ref 0–22)

## 2014-02-10 MED ORDER — CETIRIZINE HCL 10 MG PO TABS: 10.0000 mg | ORAL_TABLET | Freq: Two times a day (BID) | ORAL | Status: AC

## 2014-02-10 MED ORDER — PREDNISONE 20 MG PO TABS: ORAL_TABLET | ORAL | Status: AC

## 2014-02-11 ENCOUNTER — Telehealth: Payer: Self-pay | Admitting: Internal Medicine

## 2014-02-11 NOTE — Telephone Encounter (Signed)
Patient called about his lab results. Advised patient that his labs are completely normal per note put in by Dr. Merla Riches. Patient also states that the rash has come back in "full force" even after he finished the prednisone. Please advise  [phone]

## 2014-02-11 NOTE — Progress Notes (Signed)
Subjective:    Patient ID: [name], male    DOB: 05/01/1986, 28 y.o.   MRN: [id number]  HPI  Chief Complaint  Patient presents with  . Rash    since last thursday--both hand swollen--red rash all over the body--possible hives--Hx of boils--taking 800 mg every 6 hours of ibuprofen--no new food--new meds  . 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    Review of Systems No recent weight loss No palpitations or chest pain No cough or shortness of breath GI negative He has noticed tenderness and swelling in his ankles and wrists today    Objective:   Physical Exam BP 126/74  Pulse 98  Temp(Src) 98.5 F (36.9 C) (Oral)  Resp 18  Ht 6' 2.5" (1.892 m)  Wt 174 lb 12.8 oz (79.289 kg)  BMI 22.15 kg/m2  SpO2 99% Obviously uncomfortable with extensive urticaria over his entire body sparing his face  Conjunctiva clear PERRLA TMs clear/nares clear/oropharynx without lesions No nodes or thyromegaly Heart regular without murmur Lungs clear Abdomen benign without organomegaly Extremities-no swelling or erythema it is tender with range of motion of the ankles and  wrists No edema Skin reveals no vesciculation, petechiae, or palpable purpura, target lesions, bullous lesions.   Results for orders placed in visit on 02/10/14  COMPREHENSIVE METABOLIC PANEL      Result Value Ref Range   Sodium 138  135 - 145 mEq/L   Potassium 4.0  3.5 - 5.3 mEq/L   Chloride 100  96 - 112 mEq/L   CO2 28  19 - 32 mEq/L   Glucose, Bld 85  70 - 99 mg/dL   BUN 15  6 - 23 mg/dL   Creat 4.09  8.11 - 9.14 mg/dL   Total Bilirubin 1.2  0.2 - 1.2 mg/dL   Alkaline Phosphatase 93  39 - 117 U/L   AST 20  0 - 37 U/L   ALT 15  0 - 53 U/L   Total Protein 7.2  6.0 - 8.3 g/dL   Albumin 4.3  3.5 - 5.2 g/dL   Calcium 9.4  8.4 - 78.2 mg/dL  POCT CBC      Result Value Ref Range   WBC 9.9  4.6 - 10.2 K/uL   Lymph, poc 2.0  0.6 - 3.4   POC LYMPH PERCENT 20.6  10 - 50 %L   MID (cbc) 0.3  0 - 0.9   POC MID % 3.0  0 - 12 %M   POC Granulocyte 7.6 (*) 2 - 6.9   Granulocyte percent 76.4  37 - 80 %G   RBC 5.43  4.69 - 6.13 M/uL   Hemoglobin 16.0  14.1 - 18.1 g/dL  HCT, POC 48.8  43.5 - 53.7 %   MCV 89.9  80 - 97 fL   MCH, POC 29.4  27 - 31.2 pg   MCHC 32.7  31.8 - 35.4 g/dL   RDW, POC 16.1     Platelet Count, POC 260  142 - 424 K/uL   MPV 6.7  0 - 99.8 fL  POCT URINALYSIS DIPSTICK      Result Value Ref Range   Color, UA dark yellow     Clarity, UA cloudy     Glucose, UA neg     Bilirubin, UA small     Ketones, UA trace     Spec Grav, UA 1.020     Blood, UA neg     pH, UA 5.5     Protein, UA 30     Urobilinogen, UA 4.0     Nitrite, UA neg     Leukocytes, UA Negative    POCT UA - MICROSCOPIC ONLY      Result Value Ref Range   WBC, Ur, HPF, POC neg     RBC, urine, microscopic neg     Bacteria, U Microscopic neg     Mucus, UA trace     Epithelial cells, urine per micros neg     Crystals, Ur, HPF, POC neg     Casts, Ur, LPF, POC neg     Yeast, UA neg    POCT SEDIMENTATION RATE      Result Value Ref Range   POCT SED RATE 22  0 - 22 mm/hr       Assessment & Plan:   Fever, unspecified - Plan: POCT CBC, POCT urinalysis dipstick, POCT UA - Microscopic Only, Comprehensive metabolic panel  Allergic urticaria - Plan: POCT CBC, POCT urinalysis dipstick, POCT UA - Microscopic Only, Comprehensive metabolic panel, POCT SEDIMENTATION RATE  Arthralgia - Plan: POCT CBC, Comprehensive metabolic panel  Acute urticaria-not responding to antihistamines  Meds ordered this encounter  Medications  . predniSONE (DELTASONE) 20 MG tablet    Sig: 3/3/2/2/1/1 then 1/2,1/2,1/2 single daily dose for 9 days    Dispense:  14 tablet    Refill:  0  . cetirizine (ZYRTEC) 10 MG tablet    Sig: Take 1 tablet (10 mg total) by mouth 2 (two) times daily.    Dispense:  60 tablet    Refill:  11   Close followup

## 2014-02-12 NOTE — Telephone Encounter (Signed)
Concerned pt states he finished prednisone and he only was prescribed it 2 days ago (directions state to take for 10 days) LM for rtn call.

## 2014-02-13 NOTE — Telephone Encounter (Signed)
Lm for rtn call 

## 2014-02-14 NOTE — Telephone Encounter (Signed)
Spoke to pt he states the rash has cleared up completley.

## 2021-02-24 ENCOUNTER — Ambulatory Visit: Attending: Family Medicine | Primary: Family Medicine

## 2021-02-24 ENCOUNTER — Ambulatory Visit
Admit: 2021-02-24 | Discharge: 2021-02-24 | Payer: BLUE CROSS/BLUE SHIELD | Attending: Family Medicine | Primary: Family Medicine

## 2021-02-24 ENCOUNTER — Inpatient Hospital Stay
Admit: 2021-02-24 | Discharge: 2021-02-24 | Disposition: A | Discharge status: Home / Self Care | Payer: BLUE CROSS/BLUE SHIELD | Attending: Emergency Medicine

## 2021-02-24 ENCOUNTER — Ambulatory Visit: Admit: 2021-02-24 | Discharge: 2021-02-24 | Payer: BLUE CROSS/BLUE SHIELD | Primary: Family Medicine

## 2021-02-24 DIAGNOSIS — I318 Other specified diseases of pericardium: Secondary | ICD-10-CM

## 2021-02-24 DIAGNOSIS — R001 Bradycardia, unspecified: Secondary | ICD-10-CM

## 2021-02-24 MED ORDER — ALLOPURINOL 300 MG TAB
300 mg | ORAL_TABLET | Freq: Every day | ORAL | 0 refills | Status: AC
Start: 2021-02-24 — End: 2021-05-18

## 2021-02-24 MED ORDER — COLCHICINE 0.6 MG TAB
0.6 mg | ORAL_TABLET | Freq: Every day | ORAL | 0 refills | Status: AC
Start: 2021-02-24 — End: ?

## 2021-02-24 NOTE — ED Provider Notes (Signed)
The history is provided by the patient.   Irregular Heart Beat   This is a new problem. Episode onset: earlier today. The problem has been resolved. The problem occurs constantly. The problem is associated with nothing. Associated symptoms comments: Symptomatic, picked up bradycardia on screening EKG. Risk factors include no risk factors. He has tried nothing for the symptoms. His past medical history does not include congenital heart disease.      Past Medical History:   Diagnosis Date    Gout        Past Surgical History:   Procedure Laterality Date    HX OTHER SURGICAL Right     Elbow surgery from broken elbow         Family History:   Problem Relation Age of Onset    Diabetes Father     Heart Disease Maternal Grandmother        Social History     Socioeconomic History    Marital status: SINGLE     Spouse name: Not on file    Number of children: Not on file    Years of education: Not on file    Highest education level: Not on file   Occupational History    Not on file   Tobacco Use    Smoking status: Never    Smokeless tobacco: Never   Vaping Use    Vaping Use: Never used   Substance and Sexual Activity    Alcohol use: Not Currently     Comment: Socially    Drug use: Never    Sexual activity: Not on file   Other Topics Concern    Not on file   Social History Narrative    Not on file     Social Determinants of Health     Financial Resource Strain: Low Risk     Difficulty of Paying Living Expenses: Not hard at all   Food Insecurity: No Food Insecurity    Worried About Programme researcher, broadcasting/film/video in the Last Year: Never true    Ran Out of Food in the Last Year: Never true   Transportation Needs: Not on file   Physical Activity: Not on file   Stress: Not on file   Social Connections: Not on file   Intimate Partner Violence: Not on file   Housing Stability: Not on file         ALLERGIES: Sulfa (sulfonamide antibiotics)    Review of Systems   Cardiovascular:  Positive for palpitations.   All other systems reviewed and are  negative.    Vitals:    02/24/21 1348 02/24/21 1359 02/24/21 1414 02/24/21 1429   BP: (!) 161/88 (!) 144/84 139/70 136/70   Pulse: 68 85 76 65   Resp: 18 23 19 15    Temp: 98.6 ??F (37 ??C)      SpO2: 98% 95% 94% 94%   Weight: 105 kg (231 lb 7.7 oz)      Height: 5\' 11"  (1.803 m)               Physical Exam  Vitals and nursing note reviewed.   Constitutional:       General: He is not in acute distress.     Appearance: He is well-developed.   HENT:      Head: Normocephalic and atraumatic.   Eyes:      Conjunctiva/sclera: Conjunctivae normal.   Neck:      Trachea: No tracheal deviation.   Cardiovascular:  Rate and Rhythm: Normal rate and regular rhythm.      Heart sounds: Normal heart sounds.   Pulmonary:      Effort: Pulmonary effort is normal. No respiratory distress.      Breath sounds: Normal breath sounds.   Abdominal:      General: There is no distension.   Musculoskeletal:         General: No deformity. Normal range of motion.      Cervical back: Neck supple.   Skin:     General: Skin is warm and dry.   Neurological:      Mental Status: He is alert.      Cranial Nerves: No cranial nerve deficit.   Psychiatric:         Behavior: Behavior normal.        MDM    ED Course as of 02/26/21 1800   Tue Feb 24, 2021   1407 EKG 1335: Rate 60, Normal sinus rhythm, No ST segment or T wave abnormalities. Normal EKG.    [DK]      ED Course User Index  [DK] Truett Mainland, MD     35 y.o. male presents with bradycardia noted on outpatient EKG which caused concern. He was asymptomatic. No emergent workup indicated for uncomplicated bradycardic episode. Plan to follow up with PCP as needed and return precautions discussed for worsening or new concerning symptoms.      Procedures

## 2021-02-24 NOTE — ED Notes (Signed)
 Pt arrives to ED sent by PCP for slow heart rate.  Pt sts he went to new PCP today to establish care and refill Allopurinol  when he was noted to have HR 42 on EKG.  Pt denies CP/SOB/Dizziness.  Pt denies all physical complaints.  Pt sts he left PCP and got a call telling him to come to ER for repeat EKG and blood work.     Pt reports only PMHx gout, R elbow surgery.

## 2021-02-24 NOTE — ED Notes (Signed)
Pt provided w/DC instructions re: Dx bradycardia.  No new rx.  HR remained >60 while in ED, pt remained free of complaints/symptoms while in ER.  Pt verbalized understanding of DC instructions, denied questions/concerns.  Well appearing.  Ambulatory on DC w/steady gait.

## 2021-02-24 NOTE — Progress Notes (Signed)
 Identified pt with two pt identifiers(name and DOB).    Chief Complaint   Patient presents with    Establish Care    Medication Refill        Health Maintenance Due   Topic    Hepatitis C Screening     Depression Screen     COVID-19 Vaccine (1)    DTaP/Tdap/Td series (1 - Tdap)    Flu Vaccine (1)       Wt Readings from Last 3 Encounters:   02/24/21 234 lb (106.1 kg)     Temp Readings from Last 3 Encounters:   02/24/21 98.8 F (37.1 C) (Temporal)     BP Readings from Last 3 Encounters:   02/24/21 128/70     Pulse Readings from Last 3 Encounters:   02/24/21 (!) 45         Learning Assessment:  :     No flowsheet data found.    Depression Screening:  :     3 most recent PHQ Screens 02/24/2021   Little interest or pleasure in doing things Not at all   Feeling down, depressed, irritable, or hopeless Not at all   Total Score PHQ 2 0       Fall Risk Assessment:  :     No flowsheet data found.    Abuse Screening:  :     Abuse Screening Questionnaire 02/24/2021   Do you ever feel afraid of your partner? N   Are you in a relationship with someone who physically or mentally threatens you? N   Is it safe for you to go home? Y       Coordination of Care Questionnaire:  :     1) Have you been to an emergency room, urgent care clinic since your last visit? no   Hospitalized since your last visit? no             2) Have you seen or consulted any other health care providers outside of [facility] since your last visit? no  Previous PCP Reyes Cambric (Include any pap smears or colon screenings in this section.)    3) Do you have an Advance Directive on file? no  Are you interested in receiving information about Advance Directives? no    Patient is accompanied by N/A I have received verbal consent from Nachman Sundt to discuss any/all medical information while they are present in the room.      4.  For patients aged 53-75: Has the patient had a colonoscopy / FIT/ Cologuard? NA - based on age      If the patient is  male:    5. For patients aged 80-74: Has the patient had a mammogram within the past 2 years? NA - based on age or sex      46.  For patients aged 21-65: Has the patient had a pap smear? NA - based on age or sex

## 2021-02-24 NOTE — Progress Notes (Signed)
Dakota Rodriguez (DOB: 1985/11/06) is a 35 y.o. male, new patient, here for evaluation of the following chief complaint(s):  Establish Care and Medication Refill       SENT PATIENT TO THE ER for EKG findings.     ASSESSMENT/PLAN:  Below is the assessment and plan developed based on review of pertinent history, physical exam, labs, studies, and medications.    1. Glucose intolerance  -     CBC WITH AUTOMATED DIFF; Future  -     METABOLIC PANEL, COMPREHENSIVE; Future  -     HEMOGLOBIN A1C WITH EAG; Future  2. Pain in joints of both feet  3. Idiopathic chronic gout without tophus, unspecified site  -     allopurinoL (Zyloprim) 300 mg tablet; Take 1 Tablet by mouth daily., Normal, Disp-120 Tablet, R-0  -     colchicine 0.6 mg tablet; Take 1 Tablet by mouth daily., Normal, Disp-120 Tablet, R-0  4. Bradycardia  -     AMB POC EKG ROUTINE W/ 12 LEADS, INTER & REP  -     VITAMIN D, 25 HYDROXY; Future  -     TSH 3RD GENERATION; Future  -     T4 (THYROXINE); Future  -     MAGNESIUM; Future  5. Pure hypertriglyceridemia  -     LIPID PANEL; Future  -     VITAMIN D, 25 HYDROXY; Future  6. Sinus bradycardia seen on cardiac monitor      No follow-ups on file.      SUBJECTIVE/OBJECTIVE:  Dakota Rodriguez is a 35 y.o. male presents to the clinic to establish care with the main complaints of gout diagnosed at an early age, positive family history, diagnosed at the same age. Patient states that he has had to make dietary modifications to help control symptoms. States that he has had chest pressure in the past, while drinking beer, was taken to the ER and had a normal EKG.         Review of Systems   Constitutional:  Positive for fatigue.   Eyes: Negative.    Respiratory:  Negative for cough, chest tightness, shortness of breath and wheezing.    Cardiovascular: Negative.    Gastrointestinal: Negative.    Endocrine: Negative.    Genitourinary: Negative.    Musculoskeletal: Negative.    Skin: Negative.    Allergic/Immunologic: Negative.     Neurological: Negative.    Hematological: Negative.    Psychiatric/Behavioral: Negative.       Physical Exam  Constitutional:       Appearance: Normal appearance. He is normal weight.   HENT:      Head: Normocephalic and atraumatic.      Right Ear: External ear normal.      Left Ear: External ear normal.      Nose: Nose normal.      Mouth/Throat:      Mouth: Mucous membranes are moist.      Pharynx: Oropharynx is clear.   Eyes:      Extraocular Movements: Extraocular movements intact.      Conjunctiva/sclera: Conjunctivae normal.      Pupils: Pupils are equal, round, and reactive to light.   Cardiovascular:      Rate and Rhythm: Normal rate and regular rhythm.      Pulses: Normal pulses.      Heart sounds: Normal heart sounds.   Pulmonary:      Effort: Pulmonary effort is normal.      Breath sounds:  Normal breath sounds.   Abdominal:      General: Abdomen is flat. Bowel sounds are normal.      Palpations: Abdomen is soft.   Musculoskeletal:         General: Normal range of motion.      Cervical back: Normal range of motion and neck supple.   Skin:     General: Skin is warm.      Capillary Refill: Capillary refill takes less than 2 seconds.   Neurological:      General: No focal deficit present.      Mental Status: He is alert and oriented to person, place, and time. Mental status is at baseline.   Psychiatric:         Mood and Affect: Mood normal.         Behavior: Behavior normal.         Thought Content: Thought content normal.         Judgment: Judgment normal.           An electronic signature was used to authenticate this note.  -- Aida Raider, MD

## 2021-02-25 LAB — EKG, 12 LEAD, INITIAL
Atrial Rate: 60 {beats}/min
Calculated P Axis: 51 degrees
Calculated R Axis: 9 degrees
Calculated T Axis: 14 degrees
Diagnosis: NORMAL
P-R Interval: 158 ms
Q-T Interval: 364 ms
QRS Duration: 84 ms
QTC Calculation (Bezet): 364 ms
Ventricular Rate: 60 {beats}/min

## 2021-02-25 LAB — EKG 12-LEAD
Atrial Rate: 60 {beats}/min
Diagnosis: NORMAL
P Axis: 51 degrees
P-R Interval: 158 ms
Q-T Interval: 364 ms
QRS Duration: 84 ms
QTc Calculation (Bazett): 364 ms
R Axis: 9 degrees
T Axis: 14 degrees
Ventricular Rate: 60 {beats}/min

## 2021-02-26 LAB — CBC WITH AUTOMATED DIFF
ABS. BASOPHILS: 0 10*3/uL (ref 0.0–0.2)
ABS. EOSINOPHILS: 0.2 10*3/uL (ref 0.0–0.4)
ABS. IMM. GRANS.: 0 10*3/uL (ref 0.0–0.1)
ABS. MONOCYTES: 0.5 10*3/uL (ref 0.1–0.9)
ABS. NEUTROPHILS: 3.1 10*3/uL (ref 1.4–7.0)
Abs Lymphocytes: 1.9 10*3/uL (ref 0.7–3.1)
BASOPHILS: 1 %
EOSINOPHILS: 3 %
HCT: 46 % (ref 37.5–51.0)
HGB: 15.1 g/dL (ref 13.0–17.7)
IMMATURE GRANULOCYTES: 1 %
Lymphocytes: 33 %
MCH: 29.9 pg (ref 26.6–33.0)
MCHC: 32.8 g/dL (ref 31.5–35.7)
MCV: 91 fL (ref 79–97)
MONOCYTES: 9 %
NEUTROPHILS: 53 %
PLATELET: 164 10*3/uL (ref 150–450)
RBC: 5.05 x10E6/uL (ref 4.14–5.80)
RDW: 14 % (ref 11.6–15.4)
WBC: 5.7 10*3/uL (ref 3.4–10.8)

## 2021-02-26 LAB — METABOLIC PANEL, COMPREHENSIVE
A-G Ratio: 2.2 (ref 1.2–2.2)
ALT (SGPT): 13 [IU]/L (ref 0–44)
AST (SGOT): 16 [IU]/L (ref 0–40)
Albumin: 4.9 g/dL (ref 4.0–5.0)
Alk. phosphatase: 57 [IU]/L (ref 44–121)
BUN/Creatinine ratio: 11 (ref 9–20)
BUN: 14 mg/dL (ref 6–20)
Bilirubin, total: 1.4 mg/dL — ABNORMAL HIGH (ref 0.0–1.2)
CO2: 26 mmol/L (ref 20–29)
Calcium: 9.7 mg/dL (ref 8.7–10.2)
Chloride: 104 mmol/L (ref 96–106)
Creatinine: 1.28 mg/dL — ABNORMAL HIGH (ref 0.76–1.27)
GLOBULIN, TOTAL: 2.2 g/dL (ref 1.5–4.5)
Glucose: 87 mg/dL (ref 65–99)
Potassium: 4.5 mmol/L (ref 3.5–5.2)
Protein, total: 7.1 g/dL (ref 6.0–8.5)
Sodium: 143 mmol/L (ref 134–144)
eGFR: 74 mL/min/{1.73_m2} (ref >59)

## 2021-02-26 LAB — CVD REPORT

## 2021-02-26 LAB — HEMOGLOBIN A1C WITH EAG
Estimated average glucose: 103 mg/dL
Hemoglobin A1c: 5.2 % (ref 4.8–5.6)

## 2021-02-26 LAB — LIPID PANEL
Cholesterol, Total: 197 mg/dL (ref 100–199)
Cholesterol, total: 197 mg/dL (ref 100–199)
HDL Cholesterol: 34 mg/dL — ABNORMAL LOW (ref >39)
HDL: 34 mg/dL — ABNORMAL LOW (ref >39)
LDL Calculated: 136 mg/dL — ABNORMAL HIGH (ref 0–99)
LDL, calculated: 136 mg/dL — ABNORMAL HIGH (ref 0–99)
Triglyceride: 151 mg/dL — ABNORMAL HIGH (ref 0–149)
Triglycerides: 151 mg/dL — ABNORMAL HIGH (ref 0–149)
VLDL, calculated: 27 mg/dL (ref 5–40)
VLDL: 27 mg/dL (ref 5–40)

## 2021-02-26 LAB — VITAMIN D, 25 HYDROXY: VITAMIN D, 25-HYDROXY: 54.7 ng/mL (ref 30.0–100.0)

## 2021-02-26 LAB — T4 (THYROXINE): T4, Total: 6.3 ug/dL (ref 4.5–12.0)

## 2021-02-26 LAB — MAGNESIUM
Magnesium: 2 mg/dL (ref 1.6–2.3)
Magnesium: 2 mg/dL (ref 1.6–2.3)

## 2021-02-26 LAB — TSH 3RD GENERATION
TSH: 2.17 u[IU]/mL (ref 0.450–4.500)
TSH: 2.17 u[IU]/mL (ref 0.450–4.500)

## 2021-02-26 LAB — COMPREHENSIVE METABOLIC PANEL
ALT: 13 IU/L (ref 0–44)
AST: 16 IU/L (ref 0–40)
Albumin/Globulin Ratio: 2.2 NA (ref 1.2–2.2)
Albumin: 4.9 g/dL (ref 4.0–5.0)
Alkaline Phosphatase: 57 IU/L (ref 44–121)
BUN: 14 mg/dL (ref 6–20)
Bun/Cre Ratio: 11 NA (ref 9–20)
CO2: 26 mmol/L (ref 20–29)
Calcium: 9.7 mg/dL (ref 8.7–10.2)
Chloride: 104 mmol/L (ref 96–106)
Creatinine: 1.28 mg/dL — ABNORMAL HIGH (ref 0.76–1.27)
Est, Glomerular Filtration Rate: 74 mL/min/{1.73_m2} (ref >59)
Globulin, Total: 2.2 g/dL (ref 1.5–4.5)
Glucose: 87 mg/dL (ref 65–99)
Potassium: 4.5 mmol/L (ref 3.5–5.2)
Sodium: 143 mmol/L (ref 134–144)
Total Bilirubin: 1.4 mg/dL — ABNORMAL HIGH (ref 0.0–1.2)
Total Protein: 7.1 g/dL (ref 6.0–8.5)

## 2021-02-26 LAB — CBC WITH AUTO DIFFERENTIAL
Basophils %: 1 %
Basophils Absolute: 0 10*3/uL (ref 0.0–0.2)
Eosinophils %: 3 %
Eosinophils Absolute: 0.2 10*3/uL (ref 0.0–0.4)
Granulocyte Absolute Count: 0 10*3/uL (ref 0.0–0.1)
Hematocrit: 46 % (ref 37.5–51.0)
Hemoglobin: 15.1 g/dL (ref 13.0–17.7)
Immature Granulocytes: 1 %
Lymphocytes %: 33 %
Lymphocytes Absolute: 1.9 10*3/uL (ref 0.7–3.1)
MCH: 29.9 pg (ref 26.6–33.0)
MCHC: 32.8 g/dL (ref 31.5–35.7)
MCV: 91 fL (ref 79–97)
Monocytes %: 9 %
Monocytes Absolute: 0.5 10*3/uL (ref 0.1–0.9)
Neutrophils %: 53 %
Neutrophils Absolute: 3.1 10*3/uL (ref 1.4–7.0)
Platelets: 164 10*3/uL (ref 150–450)
RBC: 5.05 x10E6/uL (ref 4.14–5.80)
RDW: 14 % (ref 11.6–15.4)
WBC: 5.7 10*3/uL (ref 3.4–10.8)

## 2021-02-26 LAB — T4: T4, Total: 6.3 ug/dL (ref 4.5–12.0)

## 2021-02-26 LAB — HEMOGLOBIN A1C W/EAG
Hemoglobin A1C: 5.2 % (ref 4.8–5.6)
eAG: 103 mg/dL

## 2021-02-26 LAB — VITAMIN D 25 HYDROXY: Vit D, 25-Hydroxy: 54.7 ng/mL (ref 30.0–100.0)

## 2021-05-17 ENCOUNTER — Encounter

## 2021-05-18 MED ORDER — ALLOPURINOL 300 MG TAB
300 mg | ORAL_TABLET | ORAL | 1 refills | Status: AC
Start: 2021-05-18 — End: ?

## 2022-01-05 MED ORDER — ALLOPURINOL 300 MG PO TABS
300 MG | ORAL_TABLET | ORAL | 1 refills | Status: DC
Start: 2022-01-05 — End: 2022-07-12

## 2022-07-12 MED ORDER — ALLOPURINOL 300 MG PO TABS
300 MG | ORAL_TABLET | ORAL | 1 refills | Status: DC
Start: 2022-07-12 — End: 2023-04-04

## 2023-04-04 MED ORDER — ALLOPURINOL 300 MG PO TABS
300 MG | ORAL_TABLET | ORAL | 1 refills | Status: DC
Start: 2023-04-04 — End: 2023-10-12

## 2023-10-12 MED ORDER — ALLOPURINOL 300 MG PO TABS
300 | ORAL_TABLET | Freq: Every day | ORAL | 1 refills | 90.00000 days | Status: DC
Start: 2023-10-12 — End: 2023-10-12

## 2023-10-12 NOTE — Telephone Encounter (Signed)
 Unable to fill med. Has not been seen since 2022.

## 2023-10-12 NOTE — Addendum Note (Signed)
 Addended by: Amos Balint on: 10/12/2023 09:49 AM     Modules accepted: Orders

## 2023-10-12 NOTE — Telephone Encounter (Signed)
 Left voicemail letting patient know medication cannot be refilled until he has an appointment.
